# Patient Record
Sex: Male | Born: 1950 | Race: White | Hispanic: No | Marital: Married
Health system: Southern US, Academic
[De-identification: ages and names within clinical notes are randomized; demographics above are authoritative.]

---

## 1991-12-13 ENCOUNTER — Other Ambulatory Visit (HOSPITAL_COMMUNITY): Payer: Self-pay

## 2020-06-01 IMAGING — MR MRI LUMBAR SPINE WITHOUT CONTRAST
7 series · 48 of 48 positions shown · IV contrast (gadolinium)
Comparison: None available.

﻿EXAM:  [id number]   MRI LUMBAR SPINE WITHOUT CONTRAST
INDICATION: Chronic lower back pain with left lower extremity radiculopathy.
TECHNIQUE: Multiplanar multisequential MRI of the lumbar spine was performed without gadolinium contrast.

[Series 4: s-map · sagittal · 10.6mm · 5.31mm/px · 25 of 100 slices shown]
[im 1/100]
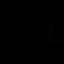
[im 5/100]
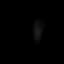
[im 9/100]
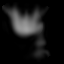
[im 13/100]
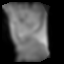
[im 17/100]
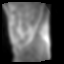
[im 21/100]
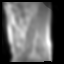
[im 25/100]
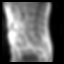
[im 29/100]
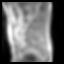
[im 34/100]
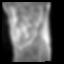
[im 38/100]
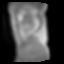
[im 42/100]
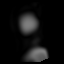
[im 46/100]
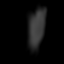
[im 50/100]
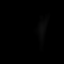
[im 54/100]
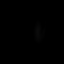
[im 58/100]
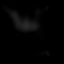
[im 62/100]
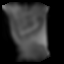
[im 67/100]
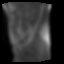
[im 71/100]
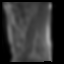
[im 75/100]
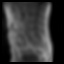
[im 79/100]
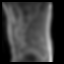
[im 83/100]
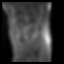
[im 87/100]
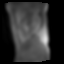
[im 91/100]
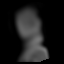
[im 95/100]
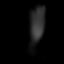
[im 100/100]
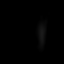

[Series 5: T2 · sagittal · 4.0mm · 1.01mm/px · 3 of 13 slices shown (1 of 3)]
[im 1/13]
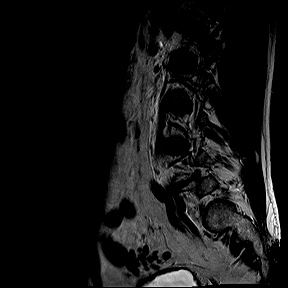
[im 7/13]
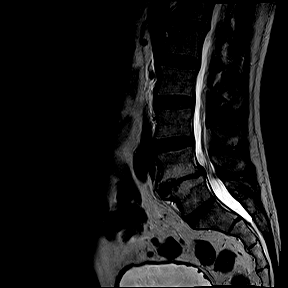
[im 13/13]
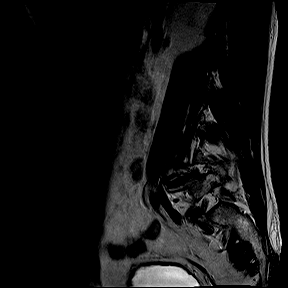

[Series 6: T1 · sagittal · 4.0mm · 1.01mm/px · 3 of 13 slices shown (1 of 2)]
[im 1/13]
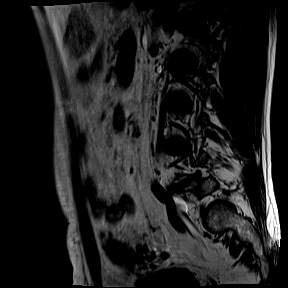
[im 7/13]
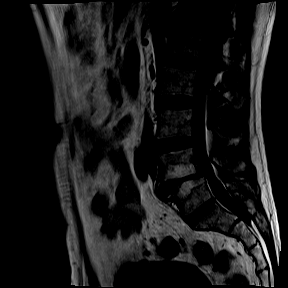
[im 13/13]
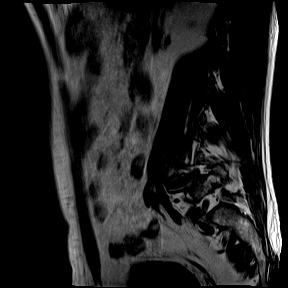

[Series 8: STIR · sagittal · 4.0mm · 1.13mm/px · 3 of 13 slices shown]
[im 1/13]
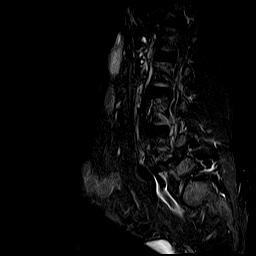
[im 7/13]
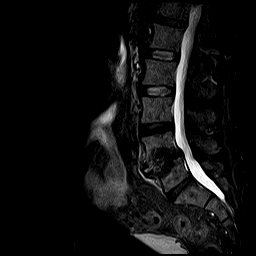
[im 13/13]
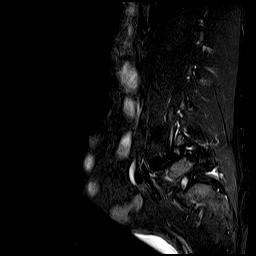

[Series 9: T2 · axial · 4.0mm · 0.52mm/px · z∈[-121,+107]mm · 5 of 23 slices shown (2 of 3)]
[im 1/23]
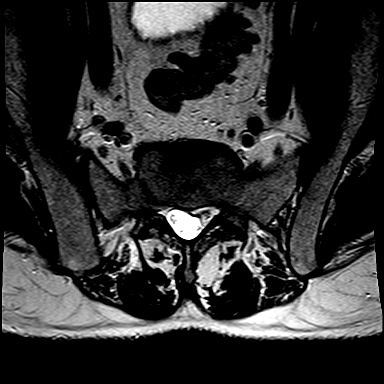
[im 6/23]
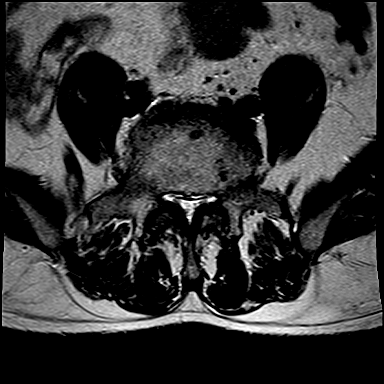
[im 12/23]
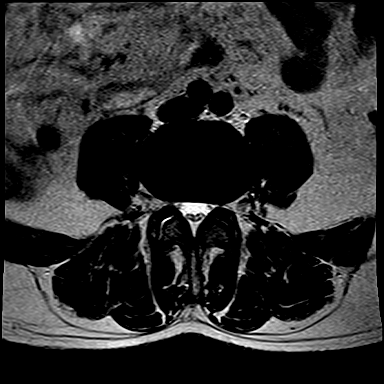
[im 17/23]
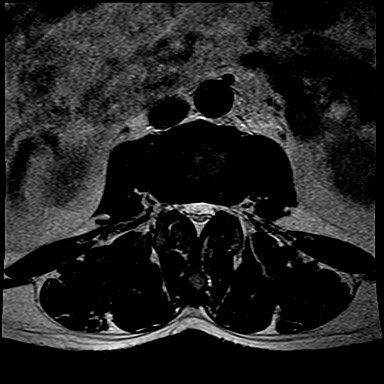
[im 23/23]
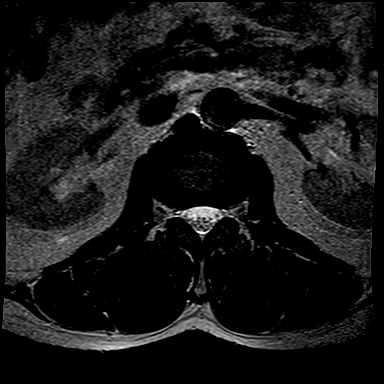

[Series 10: T1 · axial · 4.0mm · 0.52mm/px · z∈[-121,+107]mm · 5 of 23 slices shown (2 of 2)]
[im 1/23]
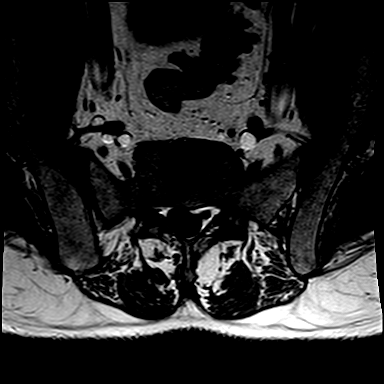
[im 6/23]
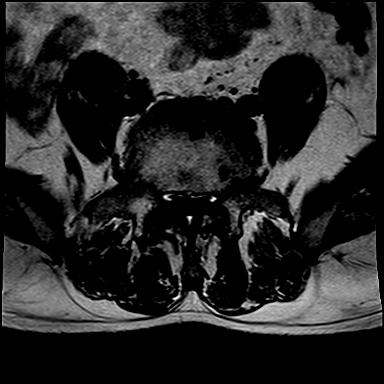
[im 12/23]
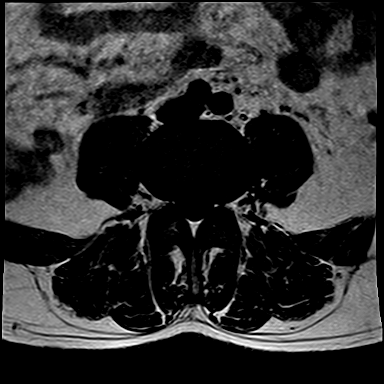
[im 17/23]
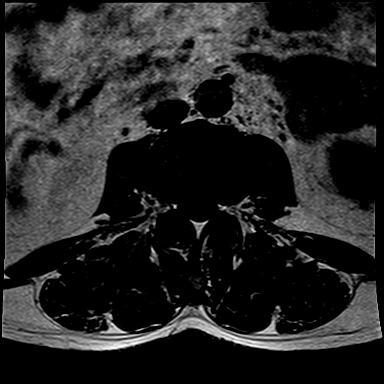
[im 23/23]
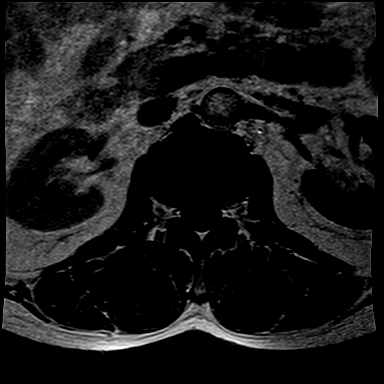

[Series 11: T2 · coronal · 5.0mm · 0.82mm/px · 4 of 18 slices shown (3 of 3)]
[im 1/18]
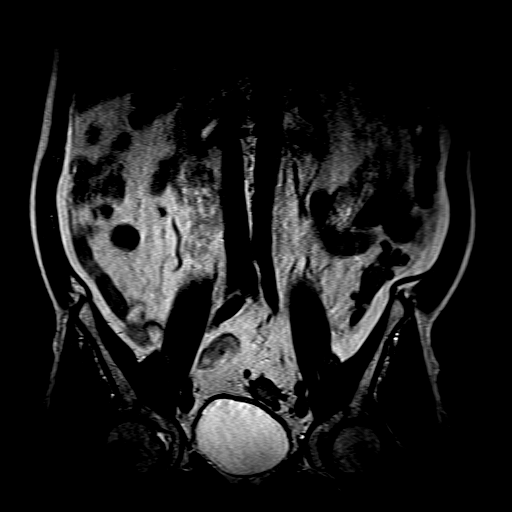
[im 6/18]
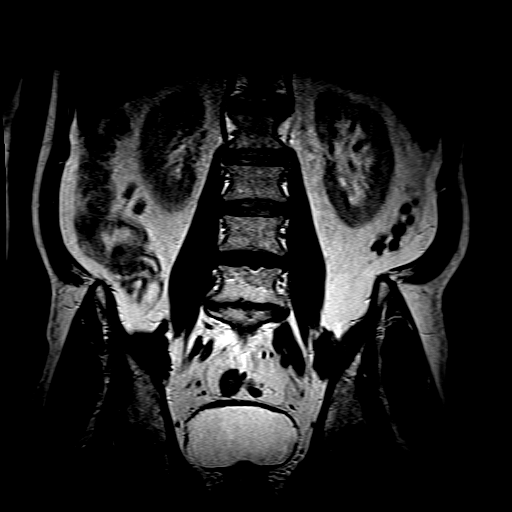
[im 12/18]
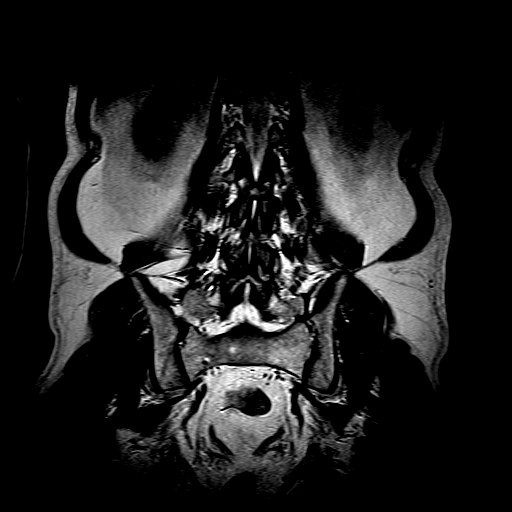
[im 18/18]
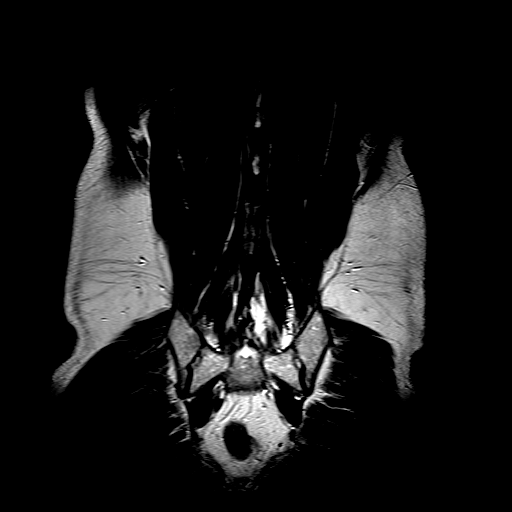

[48 of 48 positions shown; findings below may reference images not displayed]

FINDINGS: Bone marrow signal intensity is normal. There is no acute fracture or subluxation. Distal spinal cord is normal in signal intensity and terminates normally at L1-2 disc space level. Spinal canal is congenitally narrow.

L1-2 and L2-3 levels are unremarkable.

At L3-4 level, there is mild bilateral neural foraminal stenosis from facet arthropathy and bulging annulus without nerve root impingement.

At L4-5 level, there is marked disc desiccation. Modic type 2 changes are also noted within the inferior endplate of L4 and superior endplate of L5 vertebral body. There is also grade 1 anterolisthesis of L4 on L5 vertebral body. There is a small broad-based central disc bulge resulting in moderate to severe spinal stenosis. There is moderate to severe left and moderate right neural foraminal stenosis from facet arthropathy and bulging annulus.

L5-S1 level and paraspinal soft tissues are unremarkable.
IMPRESSION: 1. Grade 1 anterolisthesis of L4 on L5 vertebral body. 

2. Moderate to severe spinal stenosis at L4-5 level from a small central disc bulge. 

3. Multilevel neural foraminal stenosis as detailed above.

## 2020-08-24 IMAGING — MR MRI CERVICAL SPINE WITHOUT CONTRAST
6 series · 29 of 48 positions shown · IV contrast (gadolinium)
Comparison: None available.

﻿EXAM:  [id number]   MRI CERVICAL SPINE WITHOUT CONTRAST
INDICATION: Chronic neck pain with right upper extremity radiculopathy.
TECHNIQUE: Multiplanar multisequential MRI of the cervical spine was performed without gadolinium contrast.

[Series 4: s-map · sagittal · 8.8mm · 4.38mm/px · 8 of 100 slices shown]
[im 4/100]
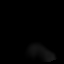
[im 16/100]
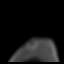
[im 32/100]
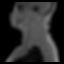
[im 44/100]
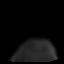
[im 56/100]
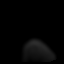
[im 68/100]
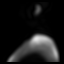
[im 84/100]
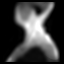
[im 96/100]
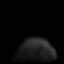

[Series 5: T2 · sagittal · 3.0mm · 0.78mm/px · 4 of 13 slices shown (1 of 2)]
[im 1/13]
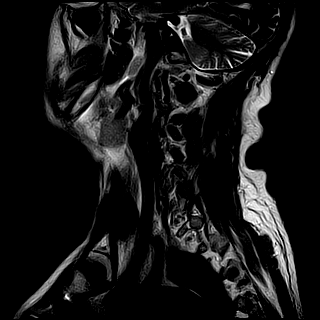
[im 5/13]
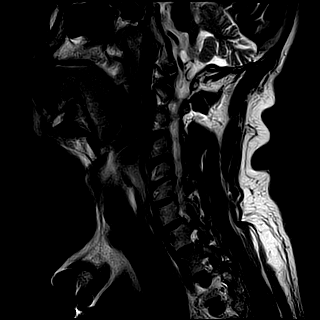
[im 9/13]
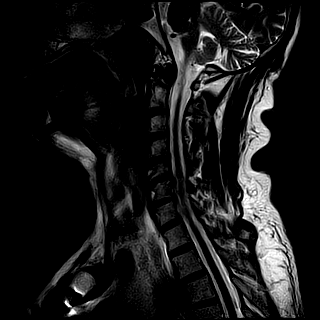
[im 13/13]
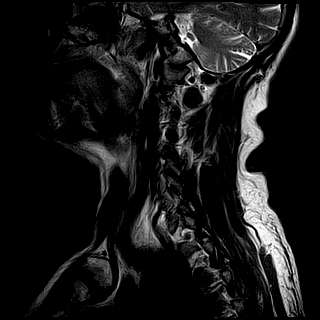

[Series 6: T1 · sagittal · 3.0mm · 0.49mm/px · 4 of 13 slices shown]
[im 1/13]
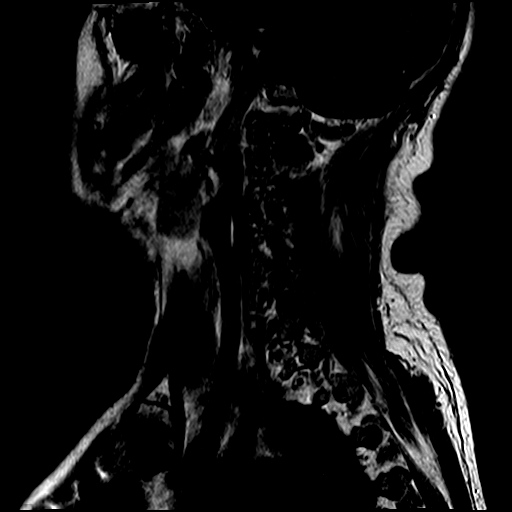
[im 5/13]
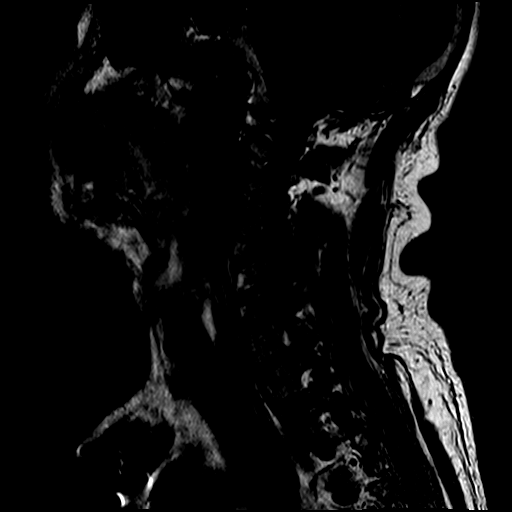
[im 9/13]
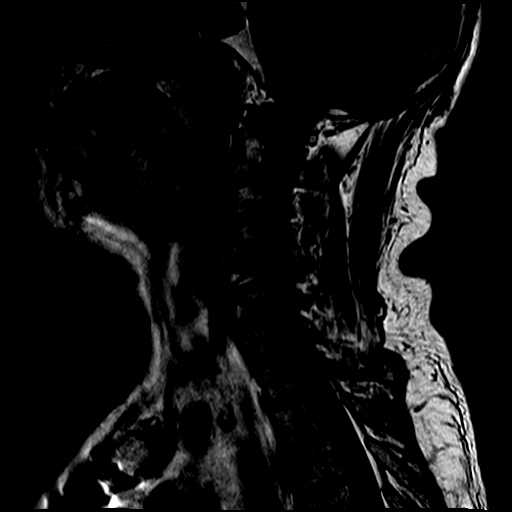
[im 13/13]
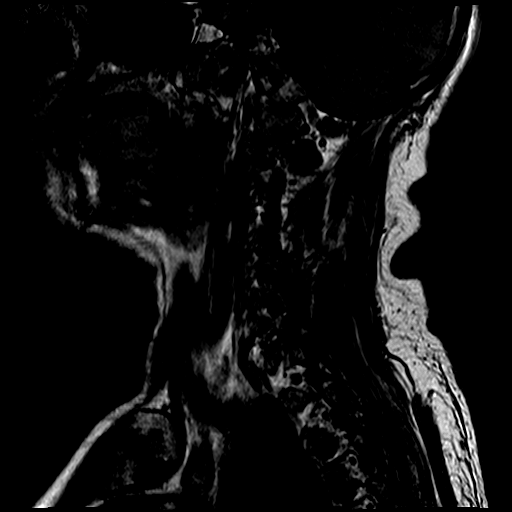

[Series 7: STIR · sagittal · 3.0mm · 0.49mm/px · 4 of 13 slices shown]
[im 1/13]
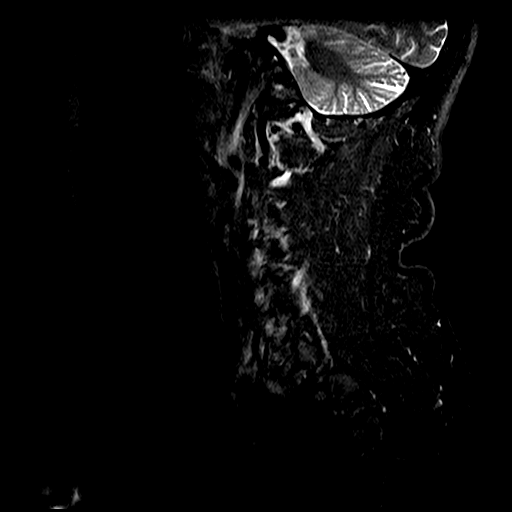
[im 5/13]
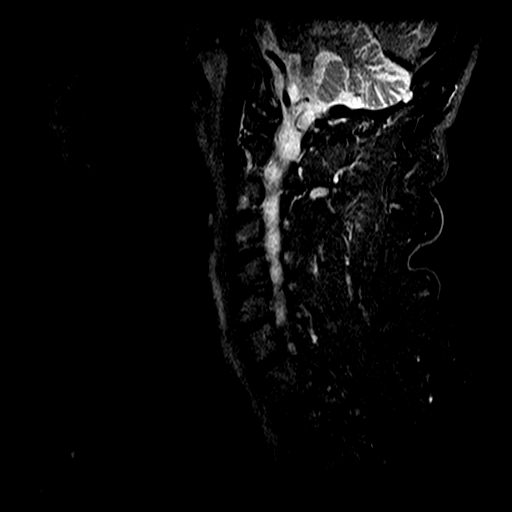
[im 9/13]
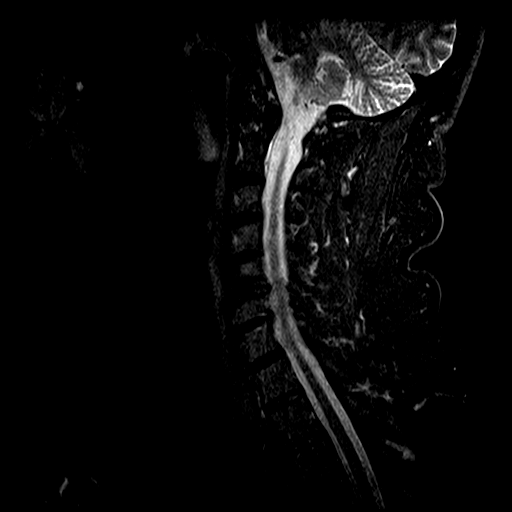
[im 13/13]
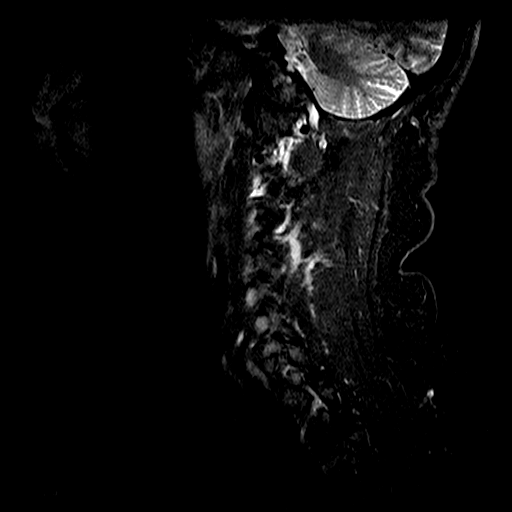

[Series 8: T2-star · axial · 3.5mm · 0.31mm/px · z∈[-67,+11]mm · 4 of 18 slices shown]
[im 1/18]
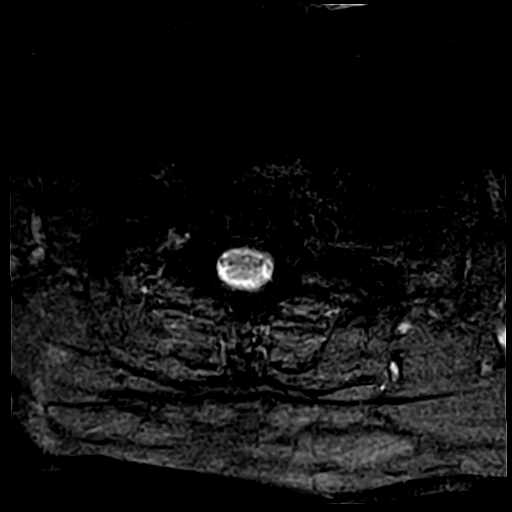
[im 5/18]
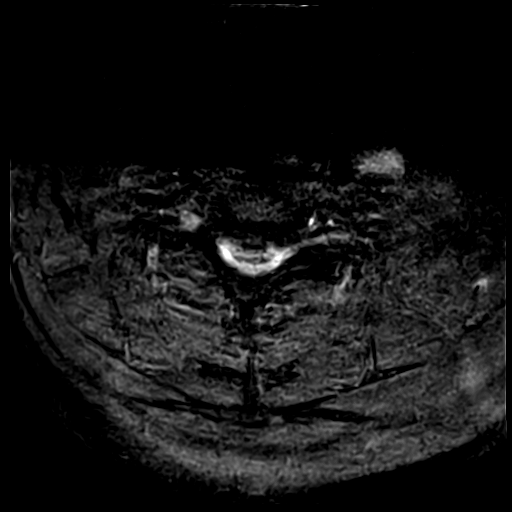
[im 9/18]
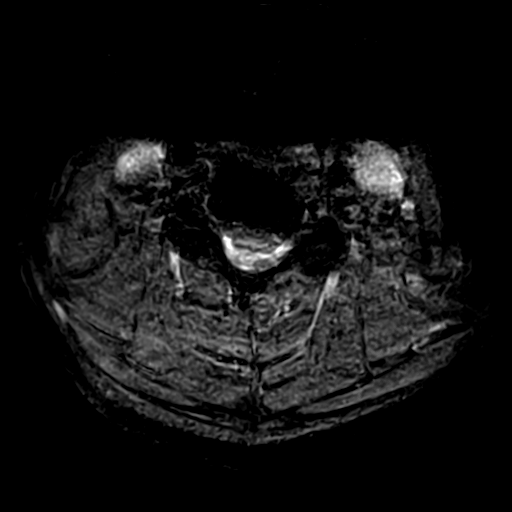
[im 13/18]
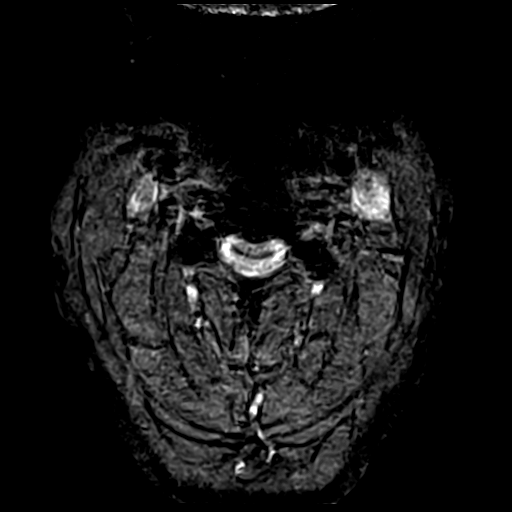

[Series 9: T2 · axial · 3.5mm · 0.31mm/px · z∈[-67,+36]mm · 5 of 18 slices shown (2 of 2)]
[im 1/18]
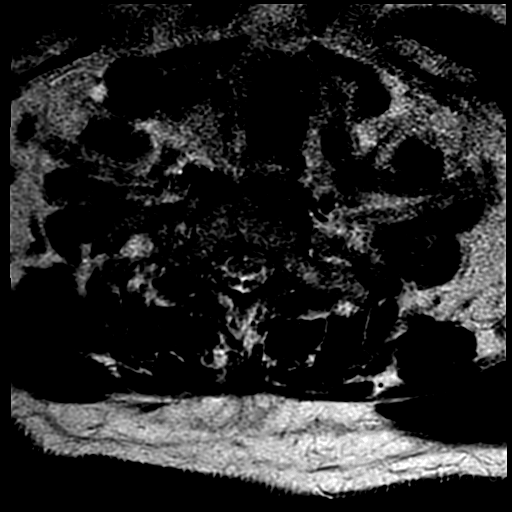
[im 5/18]
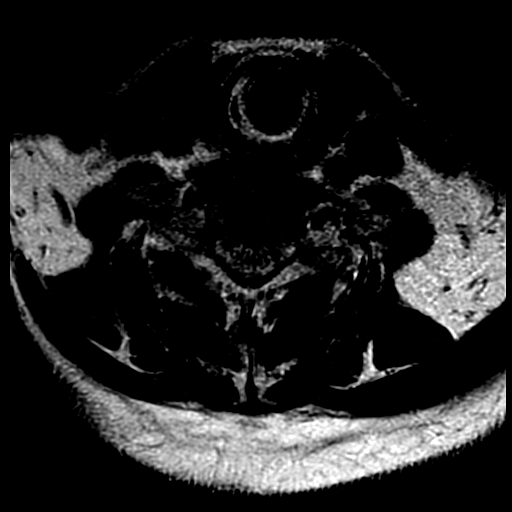
[im 9/18]
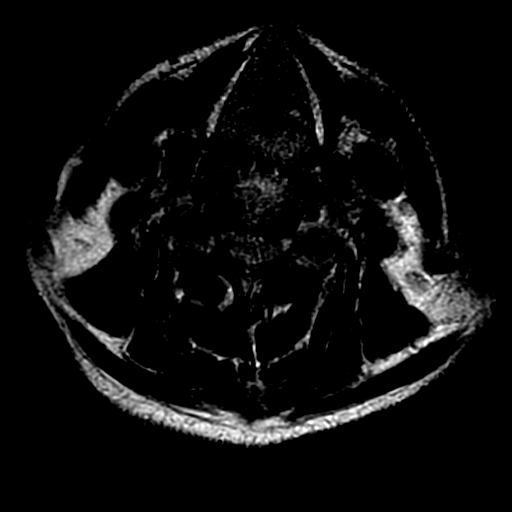
[im 13/18]
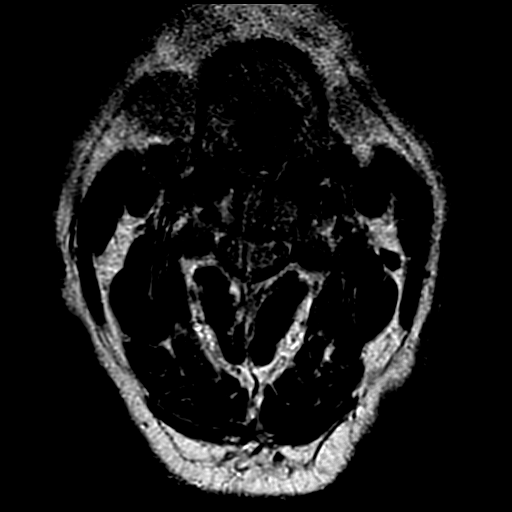
[im 18/18]
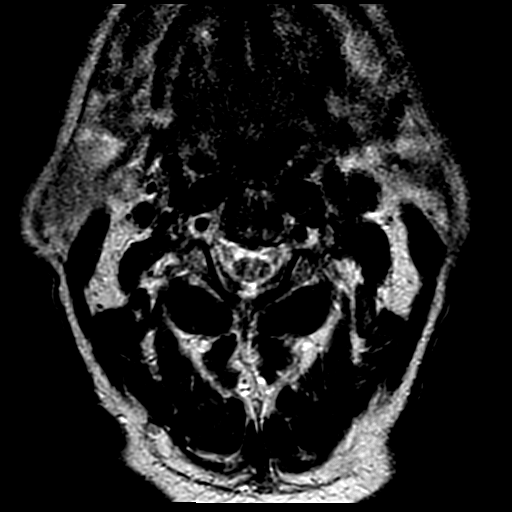

[29 of 48 positions shown; findings below may reference images not displayed]

FINDINGS: Vertebral bodies are normal in height, alignment and signal intensity. There is no acute fracture or subluxation. Visualized spinal cord is normal in signal intensity without evidence of compression at any level.

C2-3 and C3-4 levels are unremarkable.

At C4-5 level, there is a small broad-based central disc osteophyte complex partially effacing the ventral CSF. There is no significant neural foraminal stenosis.

At C5-6 level, there is a small broad-based central disc osteophyte complex partially effacing the ventral CSF. There is mild bilateral neural foraminal stenosis from facet and uncovertebral joint hypertrophy.

At C6-7 level, there is a small broad-based central disc bulge partially effacing the ventral CSF. There is no significant neural foraminal stenosis.

C7-T1 level and paraspinal soft tissues are unremarkable.
IMPRESSION: 1. Small disc bulges at multiple levels without significant spinal stenosis at any level. 

2. Mild bilateral neural foraminal stenosis at C5-6 level from facet and uncovertebral joint hypertrophy.

## 2021-08-23 ENCOUNTER — Other Ambulatory Visit (HOSPITAL_COMMUNITY): Payer: Self-pay | Admitting: FAMILY PRACTICE

## 2021-08-23 DIAGNOSIS — R079 Chest pain, unspecified: Secondary | ICD-10-CM

## 2021-08-29 ENCOUNTER — Other Ambulatory Visit (HOSPITAL_COMMUNITY): Payer: Self-pay | Admitting: FAMILY PRACTICE

## 2021-08-29 DIAGNOSIS — R079 Chest pain, unspecified: Secondary | ICD-10-CM

## 2021-08-29 DIAGNOSIS — R222 Localized swelling, mass and lump, trunk: Secondary | ICD-10-CM

## 2021-09-17 IMAGING — CT CT THORAX  W/O CONTRAST
2 of 5 series · 15 of 36 positions shown, 18 images · non-contrast
Comparison: None available.

﻿EXAM:  [id number]   CT THORAX  W/O CONTRAST
INDICATION: Pain and swelling left upper chest.
TECHNIQUE: Noncontrast multiplanar spiral computed tomography was performed.  Exam was performed using one or more of the following dose reduction techniques: Automated exposure control, adjustment of the mA and/or kV according to patient size, or the use of iterative reconstruction technique. Radiation dose 808 mGy cm.

[cor · coronal · 0.82mm/px · 3 of 78 slices shown]
[im 16/78  lung]
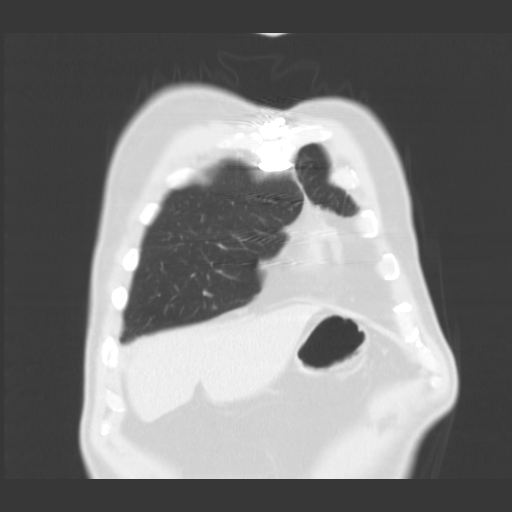
[im 31/78  lung]
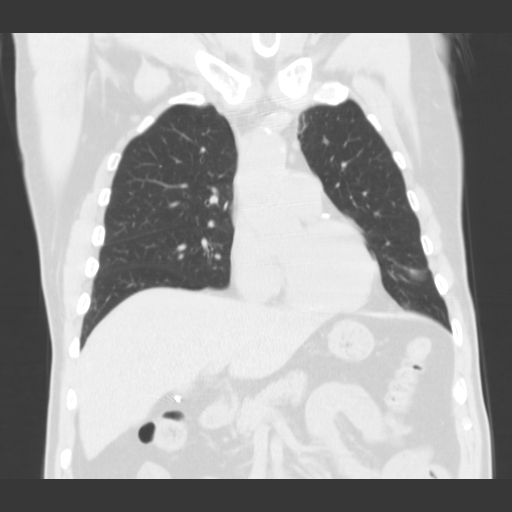
[im 47/78  lung]
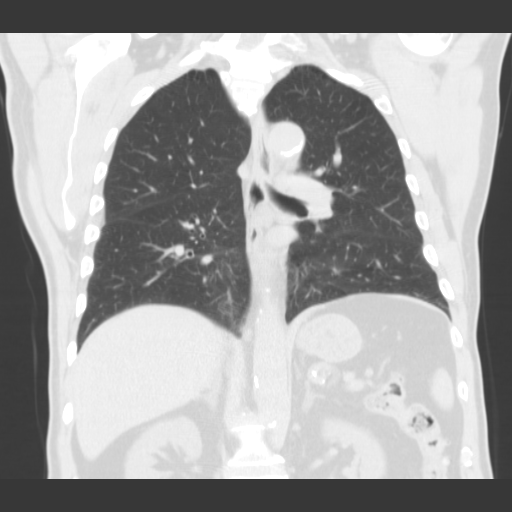

[lung · axial · 0.82mm/px · z∈[-439,-127]mm · 12 of 182 slices shown, 15 images]
[im 13/182  mediastinal]
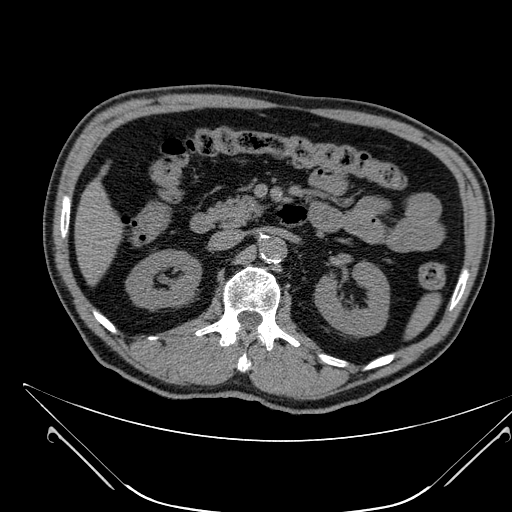
[im 13/182  lung]
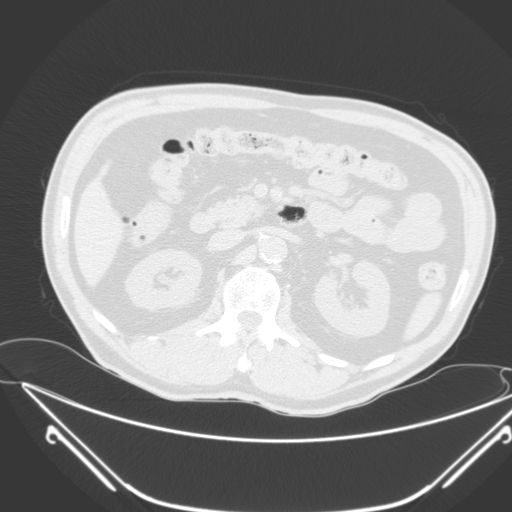
[im 26/182  lung]
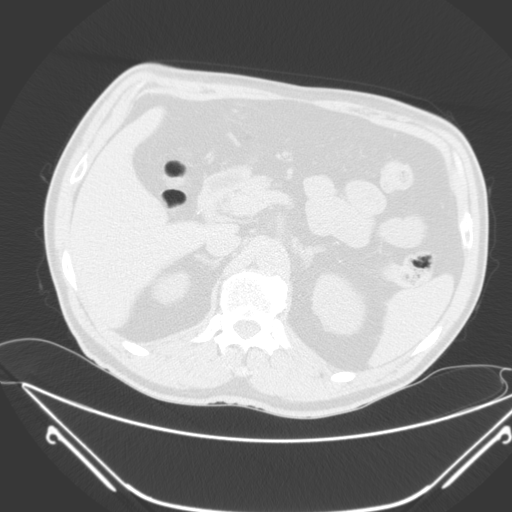
[im 39/182  lung]
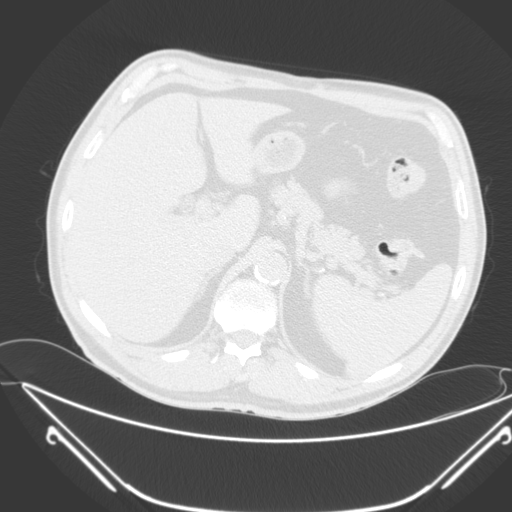
[im 52/182  lung]
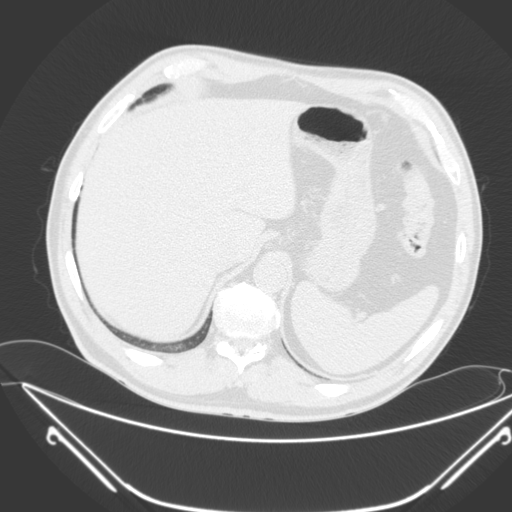
[im 65/182  mediastinal]
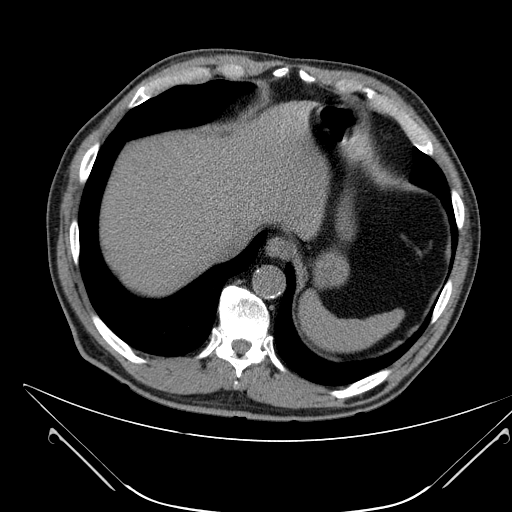
[im 65/182  lung]
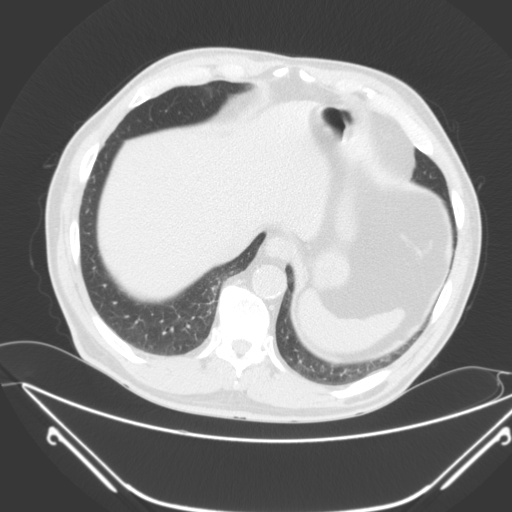
[im 78/182  lung]
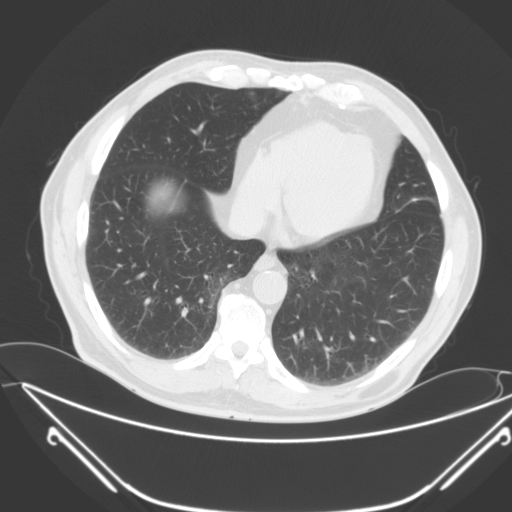
[im 104/182  lung]
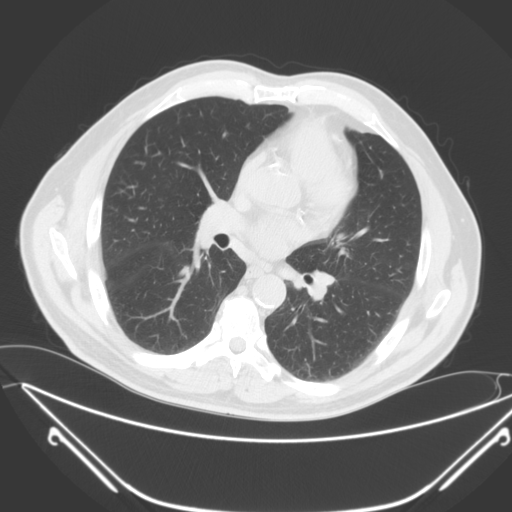
[im 117/182  lung]
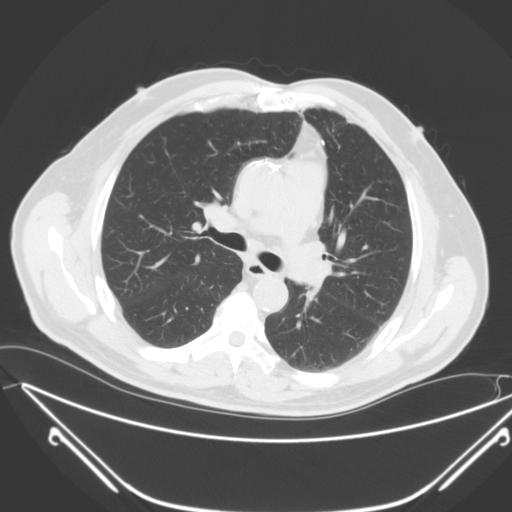
[im 130/182  mediastinal]
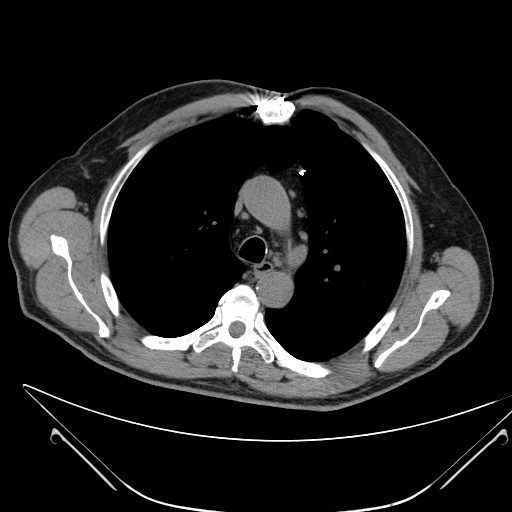
[im 130/182  lung]
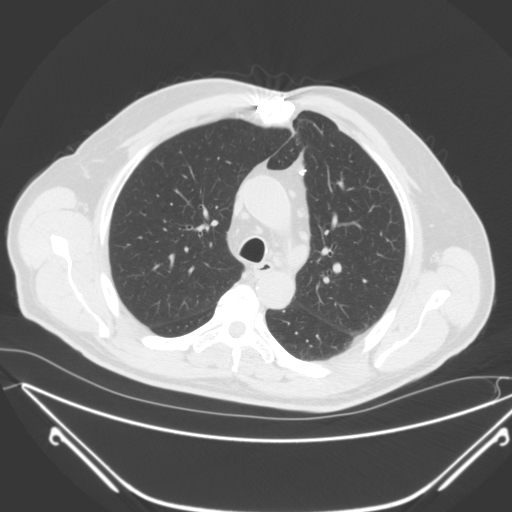
[im 143/182  lung]
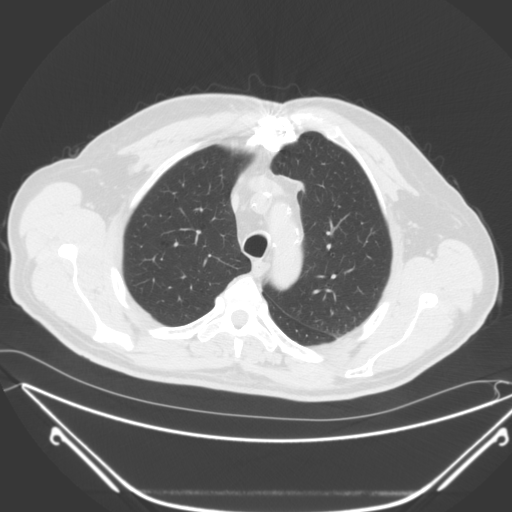
[im 156/182  lung]
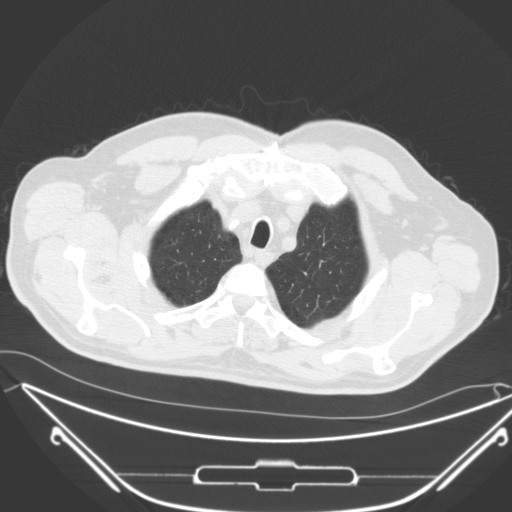
[im 169/182  lung]
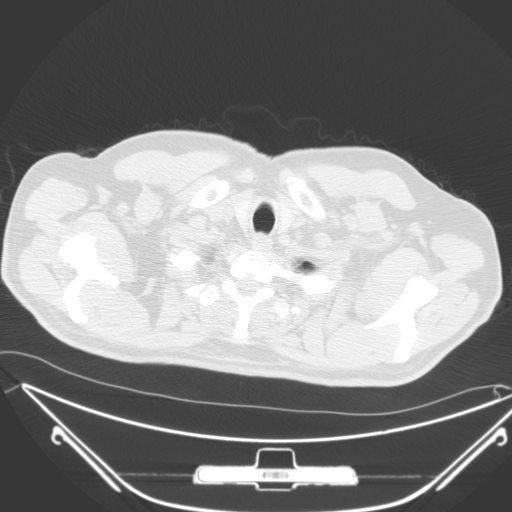

[15 of 36 positions shown; findings below may reference images not displayed]

FINDINGS: There is evidence of previous cardiac surgery.  There are multiple sternal and manubrial wires.  No mass or abnormal fluid collection is seen in the area of interest in the upper left parasternal area. 

A small calcified granuloma is noted on the left.  The lungs are otherwise clear.  

Vascular calcification is noted.  The heart is not enlarged.  There is no pleural or pericardial fluid.  No lymph node enlargement is seen. The aorta is of normal caliber.  

Prior cholecystectomy is noted incidentally.
IMPRESSION: 1. Postsurgical changes wither sternal and manubrial surgical wires.  

2. No mass, abnormal fluid collection, or inflammatory change is seen.

## 2021-09-25 ENCOUNTER — Other Ambulatory Visit (HOSPITAL_COMMUNITY): Payer: Medicare Other

## 2022-06-04 ENCOUNTER — Other Ambulatory Visit: Payer: Self-pay

## 2022-06-17 ENCOUNTER — Other Ambulatory Visit (HOSPITAL_COMMUNITY): Payer: Self-pay | Admitting: FAMILY PRACTICE

## 2022-06-17 ENCOUNTER — Other Ambulatory Visit: Payer: Self-pay

## 2022-06-17 ENCOUNTER — Ambulatory Visit (HOSPITAL_COMMUNITY)
Admission: RE | Admit: 2022-06-17 | Discharge: 2022-06-17 | Disposition: A | Discharge status: Still In Hospital | Payer: Medicare Other | Source: Ambulatory Visit

## 2022-06-17 ENCOUNTER — Encounter (HOSPITAL_COMMUNITY): Payer: Self-pay

## 2022-06-17 DIAGNOSIS — M542 Cervicalgia: Secondary | ICD-10-CM | POA: Insufficient documentation

## 2022-06-17 DIAGNOSIS — M25512 Pain in left shoulder: Secondary | ICD-10-CM
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
## 2022-06-20 ENCOUNTER — Ambulatory Visit (HOSPITAL_COMMUNITY)
Admission: RE | Admit: 2022-06-20 | Discharge: 2022-06-20 | Disposition: A | Discharge status: Still In Hospital | Payer: Medicare Other | Source: Ambulatory Visit

## 2022-06-20 ENCOUNTER — Other Ambulatory Visit: Payer: Self-pay

## 2022-06-20 NOTE — PT Treatment (Signed)
[facility]  Outpatient Physical Therapy  [address]  [place], [address]  [phone]  (Fax) [phone]    Physical Therapy Treatment Note    Date: 06/20/2022  Patient's Name: Ian Warren  Date of Birth: [birth year]  Physical Therapy Visit      Visit #/POC:2 / 12-16  Authorization:medical necessity  POC Signed?: no  POC Ends: 08/12/22  Next Progress Note Due: 10th session or 30 days      Evaluating Physical Therapist: Lunette Stands PT   PT diagnosis/Reason for Referral: cervicalgia, radiculopathy  Next Scheduled Physician Appointment: 08/02/22  Allergies/Contraindications: none          Subjective: SPS = 7 , PT C/O  LEFT SIDE NECK PAIN AND RADIATING ARM PAIN. He CANNOT STRAIGHTEN His NECK     Objective:       EXERCISE/ACTIVITY NAME REPETITIONS RESISTANCE COMPLETED THIS DOS   Manual cervical traction   na min yes   Attempted long axis stretch of scalenes, pectorals, posterior cervical mm     min yes   Pt ed on optimal positioning in sitting, supine, side lying positions    na na Yes 06/20/22                                                     Home instructions: use of a buckwheat pillow and wedge seat to reduce postural strains in lying and in seated positions. Avoidance of low sitting.     Assessment: the pt cannot achieve a neutral spine position without increase LUE pain. He was poorly tolerant of any passive mobilization. He has extreme postural abnormalities of cervical spine and significant loss of strength in his LUE. He has a major loss of motion in both his neck and shoulder. His PE suggest cervical radiculopathy. Recommend  CERVICAL MRI and possible NSU consult. The pt's symptoms are persistent over 6 months.   Short Term Goals: 3 Weeks  -Independent home program. ROM / Stabilization / Self care / Activity modification.               -Knowledge of proper body mechanics.               -Decrease in pain. Intermittent vs. constant. Worst SPS rating less than 9.                -Increase ROM / Strength  -Improve function. Decreased sleep disruption / Reduce HAS / Reduce radicular symptoms.    Plan: trial of NWB manual therapies and trial of US/KT     Total Session Time 34 and Timed code minutes 34  JOINT MOBILIZATION/MFR 20 minutes and ADL/IADL TRAINING 15 minutes      Lunette Stands, PT  06/20/2022, 1435

## 2022-06-24 ENCOUNTER — Ambulatory Visit (HOSPITAL_COMMUNITY)
Admission: RE | Admit: 2022-06-24 | Discharge: 2022-06-24 | Disposition: A | Discharge status: Still In Hospital | Payer: Medicare Other | Source: Ambulatory Visit

## 2022-06-24 ENCOUNTER — Other Ambulatory Visit: Payer: Self-pay

## 2022-06-24 NOTE — PT Treatment (Signed)
[facility]  Outpatient Physical Therapy  [address]  [place], [address]  [phone]  (Fax) [phone]    Physical Therapy Treatment Note    Date: 06/24/2022  Patient's Name: Ian Warren  Date of Birth: 1950-07-01  Physical Therapy Visit      Visit #/POC: 3 / 12-16  Authorization:medical necessity  POC Signed?: no  POC Ends: 08/12/22  Next Progress Note Due: 10th session or 30 days        Evaluating Physical Therapist: Lunette Stands PT   PT diagnosis/Reason for Referral: cervicalgia, radiculopathy  Next Scheduled Physician Appointment: 08/02/22  Allergies/Contraindications: none         Subjective: Pt notes no changes with therapy so far.  Rates pain 7/10 in neck and L UE today.  Notes still not sleeping.  Notes he has been using pillow but does not make a difference.  Pt notes still struggles with  using L UE.  Notes he is dropping things when using L hand.      Objective: Trial of [place] and KT tape today. No manual therapies d/t tenderness and reflexive guarding        EXERCISE/ACTIVITY NAME REPETITIONS RESISTANCE COMPLETED THIS DOS   Manual cervical traction    na min n   Attempted long axis stretch of scalenes, pectorals, posterior cervical mm       min n   Pt ed on optimal positioning in sitting, supine, side lying positions     na na Yes 06/24/22  Educated on KT tape wear/removal as well    [place]      1.2 w/cm2    9 min                                                                                Assessment: Pt tolerated fair.  Reflexive guarding noted, tenderness to touch, pain behaviors with [place] and application of tape.     Short Term Goals: 3 Weeks  -Independent home program. ROM / Stabilization / Self care / Activity modification.               -Knowledge of proper body mechanics.               -Decrease in pain. Intermittent vs. constant. Worst SPS rating less than 9.               -Increase ROM / Strength  -Improve function. Decreased sleep disruption / Reduce HAS  / Reduce radicular symptoms.        Plan: Pt advised to call his insurance.  Will monitor response to modalities    Total Session Time 26 and Timed code minutes 26   ULTRASOUND and ADL/IADL TRAINING 17 minutes      Margarine Grosshans, PTA  06/24/2022, 07:58

## 2022-06-26 ENCOUNTER — Ambulatory Visit (HOSPITAL_COMMUNITY)
Admission: RE | Admit: 2022-06-26 | Discharge: 2022-06-26 | Disposition: A | Discharge status: Still In Hospital | Payer: Medicare Other | Source: Ambulatory Visit

## 2022-06-26 ENCOUNTER — Other Ambulatory Visit: Payer: Self-pay

## 2022-06-26 NOTE — PT Treatment (Signed)
[facility]  Outpatient Physical Therapy  [address]  [place], [address]  [phone]  (Fax) [phone]    Physical Therapy Treatment Note    Date: 06/26/2022  Patient's Name: Ian Warren  Date of Birth: August 11, 1950  Physical Therapy Visit        Visit #/POC: 4 / 12-16  Authorization:medical necessity  POC Signed?: no  POC Ends: 08/12/22  Next Progress Note Due: 10th session or 30 days        Evaluating Physical Therapist: Lunette Stands PT   PT diagnosis/Reason for Referral: cervicalgia, radiculopathy  Next Scheduled Physician Appointment: 08/02/22  Allergies/Contraindications: none               Subjective:  States no changes.  Reports neck and L arm continues to be very painful. Pt notes he removed tape in middle of the night.  States it made him have increased pain and tingling.  Rates pain 8/10 today.  Not sure when he returns to doctor.  So far no improvement in pain/function. Pt is advised to call doctor for earlier appointment.  Also suggested he call insurance company regarding requirements to have further testing.     Objective: [place] and MHP only today.     XERCISE/ACTIVITY NAME REPETITIONS RESISTANCE COMPLETED THIS DOS   Manual cervical traction    na min n   Attempted long axis stretch of scalenes, pectorals, posterior cervical mm       min n   Pt ed on optimal positioning in sitting, supine, side lying positions     na na No 06/24/22  Educated on KT tape wear/removal as well    [place]      1.2 w/cm2    9 min                                                                              Assessment: Tolerated treatment poorly.  Pain behaviors with all activities and positions.  Removed HP after 8 min as pt noted positioning in recliner was uncomfortable.      Short Term Goals: 3 Weeks  -Independent home program. ROM / Stabilization / Self care / Activity modification.               -Knowledge of proper body mechanics.               -Decrease in pain. Intermittent vs.  constant. Worst SPS rating less than 9.               -Increase ROM / Strength  -Improve function. Decreased sleep disruption / Reduce HAS / Reduce radicular symptoms.          Plan: PT to reassess     Total Session Time 20, Timed code minutes 9, and Untimed code minutes 11   ULTRASOUND      Ian Warren, PTA  06/26/2022, 08:07

## 2022-07-01 ENCOUNTER — Ambulatory Visit
Admission: RE | Admit: 2022-07-01 | Discharge: 2022-07-01 | Disposition: A | Discharge status: Still In Hospital | Payer: Medicare Other | Source: Ambulatory Visit | Attending: FAMILY PRACTICE | Admitting: FAMILY PRACTICE

## 2022-07-01 ENCOUNTER — Other Ambulatory Visit: Payer: Self-pay

## 2022-07-01 NOTE — PT Treatment (Addendum)
[facility]  Outpatient Physical Therapy  [address]  [place], [address]  [phone]  (Fax) [phone]    Physical Therapy Treatment Note    Date: 07/01/2022  Patient's Name: Ian Warren  Date of Birth: [birth year]  Physical Therapy Visit    08/05/22 ADDENDUM: THIS PT WAS NOT REFERRED FOR MORE THERAPY AFTER THIS INTERVENTION. He IS SCHEDULED FOR AN MRI IN [place]. He IS DISCHARGED FROM THIS INTERVENTION AFTER A 5 WEEK LAPSE SINCE His LAST SESSION. He DID NOT PROGRESS WITH THERAPY. THE PT IS DISCHARGED. Lunette Stands, PT     Visit #/POC: 5 / 12-16  Authorization:medical necessity  POC Signed?: no  POC Ends: 08/12/22  Next Progress Note Due: 10th session or 30 days        Evaluating Physical Therapist: Lunette Stands PT   PT diagnosis/Reason for Referral: cervicalgia, radiculopathy  Next Scheduled Physician Appointment: 08/02/22  Allergies/Contraindications: none                 Subjective:  he is about the same . SPS = 6 this am.  Per pt,  his PCP is working on getting an MRI scheduled. He has constant tingling in his left hand  and c/o weakness of his LUE. The therapy is neither helping nor hurting.     Objective: Marland Kitchen    The pt 's strength was reasssesed today : JAMAR left grip =  63 lbs after 4 attempts ( first attempt = 35 lbs).     MMT; the pt gave poor and inconsistent effort with isolated strength testing of his biceps.   XERCISE/ACTIVITY NAME REPETITIONS RESISTANCE COMPLETED THIS DOS   Manual cervical traction    na min n   Attempted long axis stretch of scalenes, pectorals, posterior cervical mm       min n   Pt ed on optimal positioning in sitting, supine, side lying positions     na na No 06/24/22  Educated on KT tape wear/removal as well    [place]      1.2 w/cm2    9 min                                                                             circumferential measurements taken at 10 cm superior to elbow crease: 2 cm atrophy of left vs right; no measurable difference  at  forearm and there is a 0.5 cm decrease as measured about distal palmar crease.       Assessment: the pt exhibited excessive pain behavior with attempts at supine manual therapies with frequent twitching all over and grimacing with  passive movement in any direction and with LT of his neck and shoulder .  During  grip strength testing of LUE the pt gave inconsistent effort but his max power grip today was 10 lbs less than upon SOC . This could be due to pain inhibition.  He has measurable and visible atrophy of his upper arm . He strongly co contracted  his LUE during attempts to check shoulder PROM  ( the pt has full IR / ER  but blocks motion at mid range) . The pt is not tolerating minimal  NWB mobilization and reported no relief with modalities. .     Short Term Goals: 3 Weeks  -Independent home program. ROM / Stabilization / Self care / Activity modification.               -Knowledge of proper body mechanics.               -Decrease in pain. Intermittent vs. constant. Worst SPS rating less than 9.               -Increase ROM / Strength  -Improve function. Decreased sleep disruption / Reduce HAS / Reduce radicular symptoms.       Plan: due to the pt's poor tolerance to any treatment thus far, I will place the pt's treatment on hold pending MRI results. His c/o neck and LUE pain are now chronic since DOI in November of 2023.     Total Session Time 17 and Timed code minutes 17  JOINT MOBILIZATION/MFR 17 minutes      Lunette Stands, PT  07/01/2022, [phone]

## 2022-07-03 ENCOUNTER — Ambulatory Visit (HOSPITAL_COMMUNITY): Payer: Self-pay

## 2022-08-02 ENCOUNTER — Other Ambulatory Visit (HOSPITAL_COMMUNITY): Payer: Self-pay | Admitting: FAMILY PRACTICE

## 2022-08-02 DIAGNOSIS — M542 Cervicalgia: Secondary | ICD-10-CM

## 2022-08-23 ENCOUNTER — Other Ambulatory Visit: Payer: Self-pay

## 2022-08-23 ENCOUNTER — Inpatient Hospital Stay
Admission: RE | Admit: 2022-08-23 | Discharge: 2022-08-23 | Disposition: A | Discharge status: Home / Self Care | Payer: Medicare Other | Source: Ambulatory Visit | Attending: FAMILY PRACTICE | Admitting: FAMILY PRACTICE

## 2022-08-23 DIAGNOSIS — M542 Cervicalgia: Secondary | ICD-10-CM | POA: Insufficient documentation

## 2023-01-29 ENCOUNTER — Other Ambulatory Visit (HOSPITAL_COMMUNITY): Payer: Self-pay | Admitting: FAMILY PRACTICE

## 2023-01-29 DIAGNOSIS — M4807 Spinal stenosis, lumbosacral region: Secondary | ICD-10-CM

## 2023-02-24 ENCOUNTER — Other Ambulatory Visit: Payer: Self-pay

## 2023-02-24 ENCOUNTER — Ambulatory Visit
Admission: RE | Admit: 2023-02-24 | Discharge: 2023-02-24 | Disposition: A | Discharge status: Home / Self Care | Payer: Medicare Other | Source: Ambulatory Visit | Attending: FAMILY PRACTICE | Admitting: FAMILY PRACTICE

## 2023-02-24 DIAGNOSIS — M4807 Spinal stenosis, lumbosacral region: Secondary | ICD-10-CM | POA: Insufficient documentation

## 2023-08-27 ENCOUNTER — Other Ambulatory Visit (HOSPITAL_COMMUNITY): Payer: Self-pay | Admitting: Orthopaedic Surgery

## 2023-08-27 DIAGNOSIS — M48061 Spinal stenosis, lumbar region without neurogenic claudication: Secondary | ICD-10-CM

## 2023-08-28 ENCOUNTER — Other Ambulatory Visit: Payer: Self-pay

## 2023-08-28 ENCOUNTER — Encounter (HOSPITAL_COMMUNITY): Payer: Self-pay

## 2023-08-28 ENCOUNTER — Ambulatory Visit (HOSPITAL_COMMUNITY)
Admission: RE | Admit: 2023-08-28 | Discharge: 2023-08-28 | Disposition: A | Discharge status: Still In Hospital | Payer: Auto Insurance (includes no fault) | Source: Ambulatory Visit | Attending: Orthopaedic Surgery | Admitting: Orthopaedic Surgery

## 2023-08-28 DIAGNOSIS — M48061 Spinal stenosis, lumbar region without neurogenic claudication: Secondary | ICD-10-CM | POA: Insufficient documentation
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
## 2023-09-02 ENCOUNTER — Ambulatory Visit (HOSPITAL_COMMUNITY): Payer: Self-pay

## 2023-09-04 ENCOUNTER — Ambulatory Visit (HOSPITAL_COMMUNITY): Payer: Self-pay

## 2023-09-09 ENCOUNTER — Other Ambulatory Visit: Payer: Self-pay

## 2023-09-09 ENCOUNTER — Ambulatory Visit
Admission: RE | Admit: 2023-09-09 | Discharge: 2023-09-09 | Disposition: A | Discharge status: Still In Hospital | Payer: Auto Insurance (includes no fault) | Source: Ambulatory Visit | Attending: Orthopaedic Surgery | Admitting: Orthopaedic Surgery

## 2023-09-09 NOTE — PT Treatment (Addendum)
 [facility] Medicine [facility]  Outpatient Physical Therapy  [address]  [place], [address]  [phone]  (Fax) [phone]    Physical Therapy Treatment Note    Date: 09/09/2023  Patient's Name: Ian Warren  Date of Birth: Oct 03, 1950  Physical Therapy Visit    Visit #/POC: 2 / 5  Authorization: [employer]  POC Signed?: No  POC Ends: 10/02/2023  Order Ends: 10/09/2023  Next Progress Note Due: Visit 8 or 30 days    Evaluating Physical Therapist: Jhonny Heman, PT, DPT, NCS  PT diagnosis/Reason for Referral: Cervical and lumbar pain  Next Scheduled Physician Appointment: July 21st 2025  Allergies/Contraindications: NA      Subjective: Patient reports he is doing okay today. States his neck pain is the same as initial evaluation. Continues to have difficulty turning his head especially when driving.     Objective: Treatment as below:     MH x 5 min prior to manual therapy    EXERCISE/ACTIVITY NAME REPETITIONS RESISTANCE COMPLETED THIS DOS   Suboccipital release       Yes   Cervical PROM all planes with gentle end range stretch       Yes     MFR cervical paraspinals and UT       Yes     Manual cervical traction       Yes     DISCONTINUED ACTIVITIES                          Assessment: Patient with poor tolerance of manual therapy. Frequent facial grimacing noted but patient reported interventions were tolerable. Remains with restricted motion and guarding which limits efficacy of PROM. Modifications made to limit grade of pressure and ROM which slightly improved his response.    Plan: Assess response to manual therapy. Add cervical retractions.    Total Session Time 30 and Timed code minutes 30  JOINT MOBILIZATION/MFR 30 minutes      [name], PT  09/09/2023, 09:51

## 2023-09-16 ENCOUNTER — Ambulatory Visit (HOSPITAL_COMMUNITY): Payer: Self-pay

## 2023-09-23 ENCOUNTER — Other Ambulatory Visit: Payer: Self-pay

## 2023-09-23 ENCOUNTER — Ambulatory Visit
Admission: RE | Admit: 2023-09-23 | Discharge: 2023-09-23 | Disposition: A | Discharge status: Still In Hospital | Payer: Self-pay | Source: Ambulatory Visit | Attending: Orthopaedic Surgery

## 2023-09-23 DIAGNOSIS — M48061 Spinal stenosis, lumbar region without neurogenic claudication: Secondary | ICD-10-CM | POA: Insufficient documentation

## 2023-09-23 NOTE — PT Treatment (Addendum)
 [facility] Medicine [facility]  Outpatient Physical Therapy  [address]  [place], [address]  (Office) [phone]  (Fax) [phone]    PT DISCHARGE SUMMARY:  Patient seen for 3 of up to 5 planned visits to work on reducing cervical pain and improving ROM. Poor progress made with patient not being able to tolerate interventions without significant increase in pain. Discharged this date -- will benefit from follow up with referring provider for further medical mgmt given poor progress with conservative PT care. Please see last recorded functional status below:  Ian Warren, PT, DPT, NCS  09/29/2023 13:48      Physical Therapy Treatment Note    Date: 09/23/2023  Patient's Name: Ian Warren  Date of Birth: 03-Apr-1950  Physical Therapy Visit    Visit #/POC: 3 / 5  Authorization: [employer]  POC Signed?: No  POC Ends: 10/02/2023  Order Ends: 10/09/2023  Next Progress Note Due: Visit 8 or 30 days     Evaluating Physical Therapist: Jhonny Warren, PT, DPT, NCS  PT diagnosis/Reason for Referral: Cervical and lumbar pain  Next Scheduled Physician Appointment: July 21st 2025  Allergies/Contraindications: NA        Subjective: Patient states that he visited pain clinic yesterday and was told he would benefit from additional injections and ablation. Patient states he has continued to have persistent left arm pain. Reports he had to track down results of an EMG he had which revealed 2-3 pinched nerves.      Objective: Treatment as below:      MH x 5 min prior to manual therapy     EXERCISE/ACTIVITY NAME REPETITIONS RESISTANCE COMPLETED THIS DOS   Suboccipital release           Yes   Cervical AAROM all planes with gentle end range stretch           Yes      MFR cervical paraspinals and UT           Yes      Manual cervical traction           Yes   LUE Median Nerve Flossing 15  Yes   Cervical side bending stretch: to right 30s hold   Yes      DISCONTINUED ACTIVITIES                                         Assessment: Patient continues to have poor tolerance of PT intervention. Remains with pain and tenderness that limit progression of treatment. Opted for active assist ROM vs PROM today with slightly better tolerance.     Plan: Final session. Re-assess and discharge.    Total Session Time 40 and Timed code minutes 40  JOINT MOBILIZATION/MFR 40 minutes      Ian Warren, PT  09/23/2023, 12:01

## 2023-10-01 ENCOUNTER — Ambulatory Visit (HOSPITAL_COMMUNITY): Payer: Self-pay
